# Patient Record
Sex: Female | Born: 1950 | Race: White | Marital: Married | Smoking: Former smoker
Health system: Southern US, Community
[De-identification: ages and names within clinical notes are randomized; demographics above are authoritative.]

## PROBLEM LIST (undated history)

## (undated) DIAGNOSIS — D126 Benign neoplasm of colon, unspecified: Secondary | ICD-10-CM

## (undated) DIAGNOSIS — M899 Disorder of bone, unspecified: Secondary | ICD-10-CM

## (undated) DIAGNOSIS — M949 Disorder of cartilage, unspecified: Secondary | ICD-10-CM

## (undated) DIAGNOSIS — H3560 Retinal hemorrhage, unspecified eye: Secondary | ICD-10-CM

## (undated) DIAGNOSIS — M25569 Pain in unspecified knee: Secondary | ICD-10-CM

## (undated) DIAGNOSIS — J45909 Unspecified asthma, uncomplicated: Secondary | ICD-10-CM

## (undated) DIAGNOSIS — D649 Anemia, unspecified: Secondary | ICD-10-CM

## (undated) DIAGNOSIS — I10 Essential (primary) hypertension: Secondary | ICD-10-CM

## (undated) DIAGNOSIS — D473 Essential (hemorrhagic) thrombocythemia: Secondary | ICD-10-CM

## (undated) DIAGNOSIS — M1710 Unilateral primary osteoarthritis, unspecified knee: Secondary | ICD-10-CM

## (undated) DIAGNOSIS — D179 Benign lipomatous neoplasm, unspecified: Secondary | ICD-10-CM

## (undated) DIAGNOSIS — M069 Rheumatoid arthritis, unspecified: Secondary | ICD-10-CM

## (undated) DIAGNOSIS — E7800 Pure hypercholesterolemia, unspecified: Secondary | ICD-10-CM

## (undated) DIAGNOSIS — D485 Neoplasm of uncertain behavior of skin: Secondary | ICD-10-CM

## (undated) DIAGNOSIS — R319 Hematuria, unspecified: Secondary | ICD-10-CM

## (undated) DIAGNOSIS — Z87891 Personal history of nicotine dependence: Secondary | ICD-10-CM

## (undated) HISTORY — DX: Anemia, unspecified: D64.9

## (undated) HISTORY — DX: Disorder of cartilage, unspecified: M94.9

## (undated) HISTORY — DX: Rheumatoid arthritis, unspecified: M06.9

## (undated) HISTORY — DX: Neoplasm of uncertain behavior of skin: D48.5

## (undated) HISTORY — DX: Hematuria, unspecified: R31.9

## (undated) HISTORY — DX: Essential (primary) hypertension: I10

## (undated) HISTORY — DX: Benign lipomatous neoplasm, unspecified: D17.9

## (undated) HISTORY — DX: Benign neoplasm of colon, unspecified: D12.6

## (undated) HISTORY — DX: Disorder of bone, unspecified: M89.9

## (undated) HISTORY — DX: Personal history of nicotine dependence: Z87.891

## (undated) HISTORY — DX: Retinal hemorrhage, unspecified eye: H35.60

## (undated) HISTORY — PX: TONSILLECTOMY: SUR1361

## (undated) HISTORY — DX: Pure hypercholesterolemia, unspecified: E78.00

## (undated) HISTORY — DX: Unspecified asthma, uncomplicated: J45.909

## (undated) HISTORY — DX: Pain in unspecified knee: M25.569

## (undated) HISTORY — DX: Essential (hemorrhagic) thrombocythemia: D47.3

## (undated) HISTORY — DX: Unilateral primary osteoarthritis, unspecified knee: M17.10

---

## 2007-11-15 HISTORY — PX: KNEE SURGERY: SHX244

## 2011-08-15 LAB — HM MAMMOGRAPHY: HM Mammogram: NORMAL

## 2012-09-17 LAB — HM COLONOSCOPY

## 2012-12-10 ENCOUNTER — Encounter: Payer: Self-pay | Admitting: *Deleted

## 2012-12-12 ENCOUNTER — Ambulatory Visit: Payer: Self-pay | Admitting: Family Medicine

## 2012-12-12 ENCOUNTER — Encounter: Payer: Self-pay | Admitting: Family Medicine

## 2012-12-12 ENCOUNTER — Ambulatory Visit (INDEPENDENT_AMBULATORY_CARE_PROVIDER_SITE_OTHER): Payer: BC Managed Care – PPO | Admitting: Family Medicine

## 2012-12-12 VITALS — BP 114/84 | HR 68 | Ht 63.0 in | Wt 135.0 lb

## 2012-12-12 DIAGNOSIS — J45909 Unspecified asthma, uncomplicated: Secondary | ICD-10-CM

## 2012-12-12 DIAGNOSIS — M069 Rheumatoid arthritis, unspecified: Secondary | ICD-10-CM | POA: Insufficient documentation

## 2012-12-12 DIAGNOSIS — M25559 Pain in unspecified hip: Secondary | ICD-10-CM

## 2012-12-12 DIAGNOSIS — M899 Disorder of bone, unspecified: Secondary | ICD-10-CM

## 2012-12-12 DIAGNOSIS — M8580 Other specified disorders of bone density and structure, unspecified site: Secondary | ICD-10-CM

## 2012-12-12 DIAGNOSIS — M79672 Pain in left foot: Secondary | ICD-10-CM

## 2012-12-12 DIAGNOSIS — Z0000 Encounter for general adult medical examination without abnormal findings: Secondary | ICD-10-CM

## 2012-12-12 DIAGNOSIS — Z23 Encounter for immunization: Secondary | ICD-10-CM

## 2012-12-12 DIAGNOSIS — E7800 Pure hypercholesterolemia, unspecified: Secondary | ICD-10-CM

## 2012-12-12 DIAGNOSIS — M79609 Pain in unspecified limb: Secondary | ICD-10-CM

## 2012-12-12 DIAGNOSIS — M25552 Pain in left hip: Secondary | ICD-10-CM

## 2012-12-12 LAB — POCT URINALYSIS DIPSTICK
Ketones, UA: NEGATIVE
Leukocytes, UA: NEGATIVE
Nitrite, UA: NEGATIVE
Protein, UA: NEGATIVE

## 2012-12-12 LAB — LIPID PANEL
LDL Cholesterol: 162 mg/dL — ABNORMAL HIGH (ref 0–99)
Triglycerides: 143 mg/dL (ref <150)

## 2012-12-12 LAB — TSH: TSH: 1.699 u[IU]/mL (ref 0.350–4.500)

## 2012-12-12 MED ORDER — ALBUTEROL SULFATE HFA 108 (90 BASE) MCG/ACT IN AERS: 2.0000 | INHALATION_SPRAY | Freq: Four times a day (QID) | RESPIRATORY_TRACT | Status: AC | PRN

## 2012-12-12 NOTE — Progress Notes (Signed)
Chief Complaint  Patient presents with  . Annual Exam    fasting annual exam with pap. Left sided hip pain, rheumatologist did films that were negative this past Dec. Pain is worse in the morning and after sitting for long periods of time. Left foot pain that she would you to check on, again rheumatologist examined and said was okay but she would like a second opinion. Wants to discuss her bp and she if she needs to be back on bp meds. Was on amlodipine 2.5mg .   Sandra Malone is a 62 y.o. female who presents for a complete physical.  She has the following concerns:  Hyperlipidemia--previously took statins, around 3 years ago.  She had some leg pains related to statins.  Went off statin, and f/u lipid was improved, so not started on other lipid agents.  Started on 2.5mg  amlodipine for ? borderline elevated BP, started on meds after she had a retinal hemorrhage.  She has been off amlodipine since 08/2012, wonders if she needs to restart it.  RA--diagnosed in summer 2012.  On Humira since 10/2011, after failing methotrexate alone.  Managed by Dr. Cardell Peach in [facility].  His records are available for review, including lab results.  She is complaining of L side pain, at lateral hip--when she wakes up in the morning, or after sitting for a while.  Pain since November or December.  Had hip x-ray at rheum, told it was fine. L foot pain--great toe, and base of 1st and 2nd toes.  Told it was a flare of RA by her rheum, but she thinks it feels different from her usual RA  Health Maintenance: Immunization History  Administered Date(s) Administered  . Influenza Split 10/14/2012  . Zoster 09/07/2011  pt believes she has had a pneumonia vaccine in past--will check with Dr. Cardell Peach or other records. Believes last tetanus >10 years ago Last Pap smear: 08/2011; normal paps every year, no h/o abnl Last mammogram: 08/2011; scheduled for mid February 2014 Last colonoscopy: 09/2012 Dr. Loman Chroman; every 5 years Last DEXA: 3  years ago; osteopenia per pt Dentist: twice yearly Ophtho: yearly Exercise:  Walks dog daily, not aerobic Labs from Dr. Cardell Peach reviewed from 10/2012: Normal LFT's, CBC. Nonfasting: Total chol 233; HDL 59; LDL 148; TG 172 (nonfasting) Hs-CRP 0.62 (normal)  LDL 178 in 06/2012.  Past Medical History  Diagnosis Date  . Anemia     resolved  . Benign neoplasm of colon     h/o polyp  . Hematuria, unspecified     resolved  . Unspecified essential hypertension     mild; currently off medication with normal BP  . Intrinsic asthma, unspecified     spring/fall; pt reports ? mild COPD on PFT's  . Pain in joint, lower leg   . Lipoma of unspecified site     back/shoulder blade  . Neoplasm of uncertain behavior of skin   . Disorder of bone and cartilage, unspecified     osteopenia  . Primary localized osteoarthrosis, lower leg     right and left knee  . Pure hypercholesterolemia   . Retinal hemorrhage   . Rheumatoid arthritis     f/b Dr. Cardell Peach in [facility]  . Essential thrombocythemia     resolved.  . Former smoker     Past Surgical History  Procedure Date  . Knee surgery 2009    torn meniscus, LEFT; arthroscopic  . Tonsillectomy     History   Social History  . Marital Status:  Married    Spouse Name: N/A    Number of Children: 0  . Years of Education: N/A   Occupational History  . retired (former Nurse, learning disability)    Social History Main Topics  . Smoking status: Former Smoker    Types: Cigarettes    Quit date: 11/14/2005  . Smokeless tobacco: Never Used  . Alcohol Use: Yes     Comment: maybe one drink once a month, and sometimes not even that.  . Drug Use: No  . Sexually Active: Yes -- Female partner(s)     Comment: postmenopausal   Other Topics Concern  . Not on file   Social History Narrative   Lives with husband, 1 dog.  Moved from [place] around 1991    Family History  Problem Relation Age of Onset  . Cancer Mother     leukemia  . Cancer Father     . Colon cancer Father 67  . Stroke Paternal Grandmother   . Colon cancer Paternal Grandfather   . Cancer Paternal Grandfather   . Diabetes Neg Hx   . Heart disease Neg Hx     Current outpatient prescriptions:adalimumab (HUMIRA) 40 MG/0.8ML injection, Inject 40 mg into the skin every 14 (fourteen) days., Disp: , Rfl: ;  albuterol (PROVENTIL HFA;VENTOLIN HFA) 108 (90 BASE) MCG/ACT inhaler, Inhale 2 puffs into the lungs every 6 (six) hours as needed., Disp: 18 g, Rfl: 1;  aspirin 81 MG tablet, Take 81 mg by mouth daily., Disp: , Rfl:  Calcium Carbonate-Vitamin D (CALCIUM 600+D3) 600-400 MG-UNIT per tablet, Take 1 tablet by mouth daily., Disp: , Rfl: ;  fluocinonide (LIDEX) 0.05 % external solution, Apply 1 application topically as needed., Disp: , Rfl: ;  folic acid (FOLVITE) 1 MG tablet, Take 1 mg by mouth daily., Disp: , Rfl: ;  LORazepam (ATIVAN) 0.5 MG tablet, Take 0.5 mg by mouth every 8 (eight) hours., Disp: , Rfl:  methotrexate 2.5 MG tablet, Take 20 mg by mouth once a week. Takes 8 tabs once a week (20mg ), Disp: , Rfl: ;  Multiple Vitamins-Minerals (MULTIVITAMIN WITH MINERALS) tablet, Take 1 tablet by mouth daily., Disp: , Rfl: ;  diclofenac sodium (VOLTAREN) 1 % GEL, Apply 2 g topically 4 (four) times daily., Disp: , Rfl:   Allergies  Allergen Reactions  . Aleve (Naproxen Sodium) Other (See Comments)    Bruising  . Evista (Raloxifene) Other (See Comments)    Visual disturbance   ROS:  The patient denies anorexia, fever, weight changes, headaches,  vision changes, decreased hearing, ear pain, sore throat, breast concerns, chest pain, palpitations, dizziness, syncope, dyspnea on exertion, cough, swelling, nausea, vomiting, diarrhea, constipation, abdominal pain, melena, hematochezia, indigestion/heartburn, hematuria, incontinence, dysuria, vaginal bleeding, discharge, odor or itch, genital lesions, numbness, tingling, weakness, tremor, suspicious skin lesions, depression, anxiety, abnormal  bleeding/bruising, or enlarged lymph nodes. Sees dermatologist, treated for scalp acne. Infrequent anxiety (related to husband) Knee pain, shoulder--overall improved.  L foot and L hip pain as per HPI  PHYSICAL EXAM: BP 114/84  Pulse 68  Ht 5\' 3"  (1.6 m)  Wt 135 lb (61.236 kg)  BMI 23.91 kg/m2  General Appearance:    Alert, cooperative, no distress, appears stated age  Head:    Normocephalic, without obvious abnormality, atraumatic  Eyes:    PERRL, conjunctiva/corneas clear, EOM's intact, fundi    benign  Ears:    Normal TM's and external ear canals  Nose:   Nares normal, mucosa normal, no drainage or sinus  tenderness  Throat:   Lips, mucosa, and tongue normal; teeth and gums normal  Neck:   Supple, no lymphadenopathy;  thyroid:  no   enlargement/tenderness/nodules; no carotid   bruit or JVD  Back:    Spine nontender, no curvature, ROM normal, no CVA     tenderness  Lungs:     Clear to auscultation bilaterally without wheezes, rales or     ronchi; respirations unlabored  Chest Wall:    No tenderness or deformity   Heart:    Regular rate and rhythm, S1 and S2 normal, no murmur, rub   or gallop  Breast Exam:    No tenderness, masses, or nipple discharge or inversion.      No axillary lymphadenopathy  Abdomen:     Soft, non-tender, nondistended, normoactive bowel sounds,    no masses, no hepatosplenomegaly  Genitalia:    Normal external genitalia without lesions.  BUS and vagina normal; no cervical motion tenderness. No abnormal vaginal discharge.  Uterus and adnexa not enlarged, nontender, no masses.  Pap not performed (only bimanual exam performed)  Rectal:    Normal tone, no masses or tenderness; guaiac negative stool  Extremities:   No clubbing, cyanosis or edema.  Mild inflammation at L 1st and 2nd MTP's, no warmth.  Tender at L ASIS, along the superior aspect.  nontender at trochanteric bursa.  No pain with ROM of hip.  Pulses:   2+ and symmetric all extremities  Skin:   Skin  color, texture, turgor normal, no rashes or lesions  Lymph nodes:   Cervical, supraclavicular, and axillary nodes normal  Neurologic:   CNII-XII intact, normal strength, sensation and gait; reflexes 2+ and symmetric throughout          Psych:   Normal mood, affect, hygiene and grooming.    ASSESSMENT/PLAN:  1. Routine general medical examination at a health care facility  POCT Urinalysis Dipstick, TSH  2. Rheumatoid arthritis    3. Pure hypercholesterolemia  Lipid panel  4. Need for Tdap vaccination  Tdap vaccine greater than or equal to 7yo IM  5. Osteopenia  TSH, Vitamin D 25 hydroxy  6. Asthma  albuterol (PROVENTIL HFA;VENTOLIN HFA) 108 (90 BASE) MCG/ACT inhaler   Glucose, lipids, TSH, Vitamin D-OH (rest of labs done through Dr. Cardell Peach).  No pap today--next year do pap with HPV so can go 5 years between paps if normal.  Reassured that foot pain is most likely RA flare, but discussed Ddx including OA, stress fracture.  Address with Dr. Cardell Peach if ongoing pain/swelling.  Hyperlipidemia--reviewed goals (LDL<130).  HS-CRP has been normal, and HDL and ratio has been ok, so if mildly above 130, don't need to be too aggressive in treatment.  Reviewed low cholesterol diet, and will check lipids today.  Hip pain--reassured not related to hip joint.  Likely has inflammation where tendon inserts.  Shown stretches.  Recommend use of ice vs heat.  Encouraged her to use voltaren gel on this area.  Asthma--mild, intermittent, mostly seasonal.  Needs updated inhaler, so rx sent.  Osteopenia (per pt)--due for repeat DEXA.  Given written rx so that she can get done in [facility], where she previously had DEXA, so they can make accurate comparison to prior study.  Discussed monthly self breast exams and yearly mammograms after the age of 54; at least 30 minutes of aerobic activity at least 5 days/week; proper sunscreen use reviewed; healthy diet, including goals of calcium and vitamin D intake and alcohol  recommendations (  less than or equal to 1 drink/day) reviewed; regular seatbelt use; changing batteries in smoke detectors.  Immunization recommendations discussed--TdaP given today.  To find out date of pneumovax; will need to get regularly.  Colonoscopy recommendations reviewed, UTD.  Total visit time >55 minutes, all face to face, more than 1/2 counseling

## 2012-12-12 NOTE — Patient Instructions (Addendum)
HEALTH MAINTENANCE RECOMMENDATIONS:  It is recommended that you get at least 30 minutes of aerobic exercise at least 5 days/week (for weight loss, you may need as much as 60-90 minutes). This can be any activity that gets your heart rate up. This can be divided in 10-15 minute intervals if needed, but try and build up your endurance at least once a week.  Weight bearing exercise is also recommended twice weekly.  Eat a healthy diet with lots of vegetables, fruits and fiber.  "Colorful" foods have a lot of vitamins (ie green vegetables, tomatoes, red peppers, etc).  Limit sweet tea, regular sodas and alcoholic beverages, all of which has a lot of calories and sugar.  Up to 1 alcoholic drink daily may be beneficial for women (unless trying to lose weight, watch sugars).  Drink a lot of water.  Calcium recommendations are 1200-1500 mg daily (1500 mg for postmenopausal women or women without ovaries), and vitamin D 1000 IU daily.  This should be obtained from diet and/or supplements (vitamins), and calcium should not be taken all at once, but in divided doses.  Monthly self breast exams and yearly mammograms for women over the age of 57 is recommended.  Sunscreen of at least SPF 30 should be used on all sun-exposed parts of the skin when outside between the hours of 10 am and 4 pm (not just when at beach or pool, but even with exercise, golf, tennis, and yard work!)  Use a sunscreen that says "broad spectrum" so it covers both UVA and UVB rays, and make sure to reapply every 1-2 hours.  Remember to change the batteries in your smoke detectors when changing your clock times in the spring and fall.  Use your seat belt every time you are in a car, and please drive safely and not be distracted with cell phones and texting while driving.   Fat and Cholesterol Control Diet Cholesterol levels in your body are determined significantly by your diet. Cholesterol levels may also be related to heart disease. The  following material helps to explain this relationship and discusses what you can do to help keep your heart healthy. Not all cholesterol is bad. Low-density lipoprotein (LDL) cholesterol is the "bad" cholesterol. It may cause fatty deposits to build up inside your arteries. High-density lipoprotein (HDL) cholesterol is "good." It helps to remove the "bad" LDL cholesterol from your blood. Cholesterol is a very important risk factor for heart disease. Other risk factors are high blood pressure, smoking, stress, heredity, and weight. The heart muscle gets its supply of blood through the coronary arteries. If your LDL cholesterol is high and your HDL cholesterol is low, you are at risk for having fatty deposits build up in your coronary arteries. This leaves less room through which blood can flow. Without sufficient blood and oxygen, the heart muscle cannot function properly and you may feel chest pains (angina pectoris). When a coronary artery closes up entirely, a part of the heart muscle may die causing a heart attack (myocardial infarction). CHECKING CHOLESTEROL When your caregiver sends your blood to a lab to be examined for cholesterol, a complete lipid (fat) profile may be done. With this test, the total amount of cholesterol and levels of LDL and HDL are determined. Triglycerides are a type of fat that circulates in the blood. They can also be used to determine heart disease risk. The list below describes what the numbers should be: Test: Total Cholesterol.  Less than 200 mg/dl. Test: LDL "  bad cholesterol."  Less than 100 mg/dl.  Less than 70 mg/dl if you are at very high risk of a heart attack or sudden cardiac death. Test: HDL "good cholesterol."  Greater than 50 mg/dl for women.  Greater than 40 mg/dl for men. Test: Triglycerides.  Less than 150 mg/dl. CONTROLLING CHOLESTEROL WITH DIET Although exercise and lifestyle factors are important, your diet is key. That is because certain foods  are known to raise cholesterol and others to lower it. The goal is to balance foods for their effect on cholesterol and more importantly, to replace saturated and trans fat with other types of fat, such as monounsaturated fat, polyunsaturated fat, and omega-3 fatty acids. On average, a person should consume no more than 15 to 17 g of saturated fat daily. Saturated and trans fats are considered "bad" fats, and they will raise LDL cholesterol. Saturated fats are primarily found in animal products such as meats, butter, and cream. However, that does not mean you need to give up all your favorite foods. Today, there are good tasting, low-fat, low-cholesterol substitutes for most of the things you like to eat. Choose low-fat or nonfat alternatives. Choose round or loin cuts of red meat. These types of cuts are lowest in fat and cholesterol. Chicken (without the skin), fish, veal, and ground [place] breast are great choices. Eliminate fatty meats, such as hot dogs and salami. Even shellfish have little or no saturated fat. Have a 3 oz (85 g) portion when you eat lean meat, poultry, or fish. Trans fats are also called "partially hydrogenated oils." They are oils that have been scientifically manipulated so that they are solid at room temperature resulting in a longer shelf life and improved taste and texture of foods in which they are added. Trans fats are found in stick margarine, some tub margarines, cookies, crackers, and baked goods.  When baking and cooking, oils are a great substitute for butter. The monounsaturated oils are especially beneficial since it is believed they lower LDL and raise HDL. The oils you should avoid entirely are saturated tropical oils, such as coconut and palm.  Remember to eat a lot from food groups that are naturally free of saturated and trans fat, including fish, fruit, vegetables, beans, grains (barley, rice, couscous, bulgur wheat), and pasta (without cream sauces).  IDENTIFYING  FOODS THAT LOWER CHOLESTEROL  Soluble fiber may lower your cholesterol. This type of fiber is found in fruits such as apples, vegetables such as broccoli, potatoes, and carrots, legumes such as beans, peas, and lentils, and grains such as barley. Foods fortified with plant sterols (phytosterol) may also lower cholesterol. You should eat at least 2 g per day of these foods for a cholesterol lowering effect.  Read package labels to identify low-saturated fats, trans fat free, and low-fat foods at the supermarket. Select cheeses that have only 2 to 3 g saturated fat per ounce. Use a heart-healthy tub margarine that is free of trans fats or partially hydrogenated oil. When buying baked goods (cookies, crackers), avoid partially hydrogenated oils. Breads and muffins should be made from whole grains (whole-wheat or whole oat flour, instead of "flour" or "enriched flour"). Buy non-creamy canned soups with reduced salt and no added fats.  FOOD PREPARATION TECHNIQUES  Never deep-fry. If you must fry, either stir-fry, which uses very little fat, or use non-stick cooking sprays. When possible, broil, bake, or roast meats, and steam vegetables. Instead of putting butter or margarine on vegetables, use lemon and herbs, applesauce, and  cinnamon (for squash and sweet potatoes), nonfat yogurt, salsa, and low-fat dressings for salads.  LOW-SATURATED FAT / LOW-FAT FOOD SUBSTITUTES Meats / Saturated Fat (g)  Avoid: Steak, marbled (3 oz/85 g) / 11 g  Choose: Steak, lean (3 oz/85 g) / 4 g  Avoid: Hamburger (3 oz/85 g) / 7 g  Choose: Hamburger, lean (3 oz/85 g) / 5 g  Avoid: Ham (3 oz/85 g) / 6 g  Choose: Ham, lean cut (3 oz/85 g) / 2.4 g  Avoid: Chicken, with skin, dark meat (3 oz/85 g) / 4 g  Choose: Chicken, skin removed, dark meat (3 oz/85 g) / 2 g  Avoid: Chicken, with skin, light meat (3 oz/85 g) / 2.5 g  Choose: Chicken, skin removed, light meat (3 oz/85 g) / 1 g Dairy / Saturated Fat (g)  Avoid: Whole  milk (1 cup) / 5 g  Choose: Low-fat milk, 2% (1 cup) / 3 g  Choose: Low-fat milk, 1% (1 cup) / 1.5 g  Choose: Skim milk (1 cup) / 0.3 g  Avoid: Hard cheese (1 oz/28 g) / 6 g  Choose: Skim milk cheese (1 oz/28 g) / 2 to 3 g  Avoid: Cottage cheese, 4% fat (1 cup) / 6.5 g  Choose: Low-fat cottage cheese, 1% fat (1 cup) / 1.5 g  Avoid: Ice cream (1 cup) / 9 g  Choose: Sherbet (1 cup) / 2.5 g  Choose: Nonfat frozen yogurt (1 cup) / 0.3 g  Choose: Frozen fruit bar / trace  Avoid: Whipped cream (1 tbs) / 3.5 g  Choose: Nondairy whipped topping (1 tbs) / 1 g Condiments / Saturated Fat (g)  Avoid: Mayonnaise (1 tbs) / 2 g  Choose: Low-fat mayonnaise (1 tbs) / 1 g  Avoid: Butter (1 tbs) / 7 g  Choose: Extra light margarine (1 tbs) / 1 g  Avoid: Coconut oil (1 tbs) / 11.8 g  Choose: Olive oil (1 tbs) / 1.8 g  Choose: Corn oil (1 tbs) / 1.7 g  Choose: Safflower oil (1 tbs) / 1.2 g  Choose: Sunflower oil (1 tbs) / 1.4 g  Choose: Soybean oil (1 tbs) / 2.4 g  Choose: Canola oil (1 tbs) / 1 g Document Released: 10/31/2005 Document Revised: 01/23/2012 Document Reviewed: 04/21/2011 [facility] Patient Information 2013 [place], [place].

## 2012-12-13 LAB — VITAMIN D 25 HYDROXY (VIT D DEFICIENCY, FRACTURES): Vit D, 25-Hydroxy: 46 ng/mL (ref 30–89)

## 2013-01-07 LAB — HM MAMMOGRAPHY: HM Mammogram: NEGATIVE

## 2013-01-09 ENCOUNTER — Encounter: Payer: Self-pay | Admitting: Internal Medicine

## 2013-07-09 ENCOUNTER — Encounter: Payer: Self-pay | Admitting: Family Medicine

## 2013-07-25 ENCOUNTER — Ambulatory Visit (INDEPENDENT_AMBULATORY_CARE_PROVIDER_SITE_OTHER): Payer: BC Managed Care – PPO | Admitting: Family Medicine

## 2013-07-25 ENCOUNTER — Encounter: Payer: Self-pay | Admitting: Family Medicine

## 2013-07-25 VITALS — BP 130/70 | HR 76 | Ht 63.0 in | Wt 136.0 lb

## 2013-07-25 DIAGNOSIS — Z23 Encounter for immunization: Secondary | ICD-10-CM

## 2013-07-25 DIAGNOSIS — E7800 Pure hypercholesterolemia, unspecified: Secondary | ICD-10-CM

## 2013-07-25 NOTE — Patient Instructions (Signed)
Continue low cholesterol diet, daily exercise. Plan to check [facility] labs (looking at particle size and number) when you come for you physical.

## 2013-07-25 NOTE — Progress Notes (Signed)
Chief Complaint  Patient presents with  . Follow-up    lab results.   Patient presents for follow-up on her cholesterol.  Labs were drawn through her rheumatologist (see scanned sheet).  Her risk factors were reviewed.  There is family h/o stroke (PGM in 52's), not at young age.  Pt with no risk factors--had some high blood pressures when dealing with an eye issue; has been off medications with normal blood pressures since 09/2012. She quit smoking in 2007.  She follows a diet fairly low in cholesterol, reporting that her "downfall" is her cheese, but not having daily.  She informs me/reminds me today that her medications can contribute to her elevated cholesterol.    RA:  Mostly has pain in her knees.  Intermittent pain in her right arm, swelling in left foot.  Pain is minor.  Overall is doing very well controlled on her current regimen.  Past Medical History  Diagnosis Date  . Anemia     resolved  . Benign neoplasm of colon     h/o polyp  . Hematuria, unspecified     resolved  . Unspecified essential hypertension     mild; currently off medication with normal BP  . Intrinsic asthma, unspecified     spring/fall; pt reports ? mild COPD on PFT's  . Pain in joint, lower leg   . Lipoma of unspecified site     back/shoulder blade  . Neoplasm of uncertain behavior of skin   . Disorder of bone and cartilage, unspecified     osteopenia  . Primary localized osteoarthrosis, lower leg     right and left knee  . Pure hypercholesterolemia   . Retinal hemorrhage   . Rheumatoid arthritis(714.0)     f/b Dr. Cardell Peach in [facility]  . Essential thrombocythemia     resolved.  . Former smoker    Past Surgical History  Procedure Laterality Date  . Knee surgery  2009    torn meniscus, LEFT; arthroscopic  . Tonsillectomy     History   Social History  . Marital Status: Married    Spouse Name: N/A    Number of Children: 0  . Years of Education: N/A   Occupational History  . retired (former  Nurse, learning disability)    Social History Main Topics  . Smoking status: Former Smoker    Types: Cigarettes    Quit date: 11/14/2005  . Smokeless tobacco: Never Used  . Alcohol Use: Yes     Comment: maybe one drink once a month, and sometimes not even that.  . Drug Use: No  . Sexual Activity: Yes    Partners: Male     Comment: postmenopausal   Other Topics Concern  . Not on file   Social History Narrative   Lives with husband, 1 dog.  Moved from [place] around 1991   Family History  Problem Relation Age of Onset  . Cancer Mother     leukemia  . Cancer Father   . Colon cancer Father 34  . Stroke Paternal Grandmother     died from stroke, probably age 31's  . Colon cancer Paternal Grandfather   . Cancer Paternal Grandfather   . Diabetes Neg Hx   . Heart disease Neg Hx    Current outpatient prescriptions:adalimumab (HUMIRA) 40 MG/0.8ML injection, Inject 40 mg into the skin every 14 (fourteen) days., Disp: , Rfl: ;  aspirin 81 MG tablet, Take 81 mg by mouth daily., Disp: , Rfl: ;  Calcium Carbonate-Vitamin D (CALCIUM 600+D3) 600-400 MG-UNIT per tablet, Take 1 tablet by mouth daily., Disp: , Rfl: ;  folic acid (FOLVITE) 1 MG tablet, Take 1 mg by mouth daily., Disp: , Rfl:  methotrexate 2.5 MG tablet, Take 20 mg by mouth once a week. Takes 8 tabs once a week (20mg ), Disp: , Rfl: ;  Multiple Vitamins-Minerals (MULTIVITAMIN WITH MINERALS) tablet, Take 1 tablet by mouth daily., Disp: , Rfl: ;  albuterol (PROVENTIL HFA;VENTOLIN HFA) 108 (90 BASE) MCG/ACT inhaler, Inhale 2 puffs into the lungs every 6 (six) hours as needed., Disp: 18 g, Rfl: 1 diclofenac sodium (VOLTAREN) 1 % GEL, Apply 2 g topically 4 (four) times daily., Disp: , Rfl: ;  fluocinonide (LIDEX) 0.05 % external solution, Apply 1 application topically as needed., Disp: , Rfl: ;  LORazepam (ATIVAN) 0.5 MG tablet, Take 0.5 mg by mouth every 8 (eight) hours., Disp: , Rfl:   Allergies  Allergen Reactions  . Aleve [Naproxen  Sodium] Other (See Comments)    Bruising  . Evista [Raloxifene] Other (See Comments)    Visual disturbance    ROS:  Denies headaches, dizziness, chest pain, URI symptoms, shortness of breath, bleeding/bruising.  Joint pains as per HPI.  Moods are good  PHYSICAL EXAM: BP 130/70  Pulse 76  Ht 5\' 3"  (1.6 m)  Wt 136 lb (61.689 kg)  BMI 24.1 kg/m2 Well developed, pleasant female in no distress Remainder of visit was limited to discussion and counseling.  Labs: Total chol 234;  TG 106; HDL 58; LDL 155; chol/HDL ratio 4  ASSESSMENT/PLAN:  Pure hypercholesterolemia - slight improvement since January. Still borderline.  reviewed diet, exercise, effects of genes and meds.  Check [facility] at CPE  Need for prophylactic vaccination and inoculation against influenza - Plan: Flu Vaccine QUAD 36+ mos IM  Continue low cholesterol diet, daily exercise.  Has CPE in February--plan to do [facility] labs at that time, in addition to other labs needed.

## 2014-02-06 ENCOUNTER — Other Ambulatory Visit (HOSPITAL_COMMUNITY)
Admission: RE | Admit: 2014-02-06 | Discharge: 2014-02-06 | Disposition: A | Discharge status: Home / Self Care | Payer: BC Managed Care – PPO | Source: Ambulatory Visit | Attending: Family Medicine | Admitting: Family Medicine

## 2014-02-06 ENCOUNTER — Ambulatory Visit (INDEPENDENT_AMBULATORY_CARE_PROVIDER_SITE_OTHER): Payer: BC Managed Care – PPO | Admitting: Family Medicine

## 2014-02-06 ENCOUNTER — Encounter: Payer: Self-pay | Admitting: Family Medicine

## 2014-02-06 VITALS — BP 132/80 | HR 64 | Ht 63.0 in | Wt 136.0 lb

## 2014-02-06 DIAGNOSIS — Z0000 Encounter for general adult medical examination without abnormal findings: Secondary | ICD-10-CM

## 2014-02-06 DIAGNOSIS — E7800 Pure hypercholesterolemia, unspecified: Secondary | ICD-10-CM

## 2014-02-06 DIAGNOSIS — J45909 Unspecified asthma, uncomplicated: Secondary | ICD-10-CM

## 2014-02-06 DIAGNOSIS — Z01419 Encounter for gynecological examination (general) (routine) without abnormal findings: Secondary | ICD-10-CM | POA: Insufficient documentation

## 2014-02-06 DIAGNOSIS — Z1151 Encounter for screening for human papillomavirus (HPV): Secondary | ICD-10-CM | POA: Insufficient documentation

## 2014-02-06 LAB — POCT URINALYSIS DIPSTICK
Bilirubin, UA: NEGATIVE
Glucose, UA: NEGATIVE
Ketones, UA: NEGATIVE
Leukocytes, UA: NEGATIVE
NITRITE UA: NEGATIVE
PH UA: 5
Protein, UA: NEGATIVE
RBC UA: NEGATIVE
Spec Grav, UA: <=1.005
UROBILINOGEN UA: NEGATIVE

## 2014-02-06 NOTE — Progress Notes (Signed)
Chief Complaint  Patient presents with  . Annual Exam    fasting annual exam with pap. Did not do eye exam today she had one with [facility] care recently.  Yesterday developed a pain on her left side thought she would bring up while she was here today.    Sandra Malone is a 63 y.o. female who presents for a complete physical.  She has the following concerns:  Yesterday she noted some discomfort in her left groin.  Pain is mild, only pays attention to it and notices it when she is sitting inactive. Describes it as an ache.  No pain with standing, stairs, walking.  Hyperlipidemia:  Due for follow up today. HS-CRP was normal in the past; recommended goal LDL <130.  Last check here 1 year ago her cholesterol was high: Lab Results  Component Value Date   CHOL 246* 12/12/2012   HDL 55 12/12/2012   LDLCALC 162* 12/12/2012   TRIG 143 12/12/2012   CHOLHDL 4.5 12/12/2012   She had been on low cholesterol diet, and had labs done by rheumatologist in 06/2013.  These showed total chol of 234, TG 106, HDL 58, LDL 155, chol/HDL ratio of 4. She was seen here in f/u in September, and recommended to continue low cholesterol diet, and to have [facility] study drawn at her physical.  Asthma:  She has seasonal allergies, with seasonal wheezing when allergies are flaring, usually just in the fall/spring.  She hasn't needed to use any albuterol in over a year.  She has an unopened inhaler at home.  Allergies haven't started flaring yet this spring.  Osteopenia:  She never went for DEXA last year as prescribed (to be done in [facility]).  She was put on Evista at one point many years ago (not after the last DEXA 4 years ago), and she didn't tolerate it--had some visual disturbance/blurring.  When talking to the doctor it wasn't really clear that she needed to take it, med was not replaced once she had side effects, and no further treatment was recommended after subsequent bone density tests.  RA:  Well controlled with  Humira and methotrexate.  Monitored by Dr. Abner Greenspan.  Immunization History  Administered Date(s) Administered  . Influenza Split 10/14/2012  . Influenza,inj,Quad PF,36+ Mos 07/25/2013  . Tdap 12/12/2012  . Zoster 09/07/2011  She believes she had pneumonia vaccine in the past. This is in fact documented that she had one in an old note from Dr. Abner Greenspan, but the date was not noted.  She believes it was within 5 years.  She has appt with him next month Last Pap smear: 08/2011 Last mammogram:  February 2014, had to cancel it due to broken machine; has appt scheduled Last colonoscopy: 09/2012 Dr. Dorrene German; every 5 years  Last DEXA: 4 years ago; osteopenia per pt; rx given last year to get again in [employer] Dentist: twice yearly  Ophtho: yearly  Exercise: Walks dog daily, not aerobic  Past Medical History  Diagnosis Date  . Anemia     resolved  . Benign neoplasm of colon     h/o polyp  . Hematuria, unspecified     resolved  . Unspecified essential hypertension     mild; currently off medication with normal BP (put on originally due to abnormal eye finding)  . Intrinsic asthma, unspecified     spring/fall; pt reports ? mild COPD on PFT's  . Pain in joint, lower leg   . Lipoma of unspecified site  back/shoulder blade  . Neoplasm of uncertain behavior of skin   . Disorder of bone and cartilage, unspecified     osteopenia  . Primary localized osteoarthrosis, lower leg     right and left knee  . Pure hypercholesterolemia   . Retinal hemorrhage   . Rheumatoid arthritis(714.0)     f/b Dr. Abner Greenspan in [facility]  . Essential thrombocythemia     resolved.  . Former smoker     Past Surgical History  Procedure Laterality Date  . Knee surgery Left 2009    torn meniscus, LEFT; arthroscopic  . Tonsillectomy      History   Social History  . Marital Status: Married    Spouse Name: N/A    Number of Children: 0  . Years of Education: N/A   Occupational History  . retired (former Energy manager)    Social History Main Topics  . Smoking status: Former Smoker    Types: Cigarettes    Quit date: 11/14/2005  . Smokeless tobacco: Never Used  . Alcohol Use: Yes     Comment: maybe one drink once a month, and sometimes not even that.  . Drug Use: No  . Sexual Activity: Yes    Partners: Male     Comment: postmenopausal   Other Topics Concern  . Not on file   Social History Narrative   Lives with husband, 1 dog.  Moved from [place] around 1991    Family History  Problem Relation Age of Onset  . Cancer Mother     leukemia  . Glaucoma Mother   . Cancer Father   . Colon cancer Father 70  . Stroke Paternal Grandmother     died from stroke, probably age 2's  . Colon cancer Paternal Grandfather   . Cancer Paternal Grandfather   . Diabetes Neg Hx   . Heart disease Neg Hx   . Arthritis Maternal Grandmother     rheumatoid arthritis   Outpatient Encounter Prescriptions as of 02/06/2014  Medication Sig Note  . adalimumab (HUMIRA) 40 MG/0.8ML injection Inject 40 mg into the skin every 14 (fourteen) days.   Marland Kitchen aspirin 81 MG tablet Take 81 mg by mouth daily.   . Calcium Carbonate-Vitamin D (CALCIUM 600+D3) 600-400 MG-UNIT per tablet Take 1 tablet by mouth daily.   . folic acid (FOLVITE) 1 MG tablet Take 1 mg by mouth daily.   Marland Kitchen LORazepam (ATIVAN) 0.5 MG tablet Take 0.5 mg by mouth every 8 (eight) hours. 12/12/2012: Uses prn for "nerves"; usually used around 2x/month  . methotrexate 2.5 MG tablet Take 20 mg by mouth once a week. Takes 8 tabs once a week (37m)   . Multiple Vitamins-Minerals (MULTIVITAMIN WITH MINERALS) tablet Take 1 tablet by mouth daily.   .Marland Kitchenalbuterol (PROVENTIL HFA;VENTOLIN HFA) 108 (90 BASE) MCG/ACT inhaler Inhale 2 puffs into the lungs every 6 (six) hours as needed. 02/06/2014: Has not needed to use any rescue inhaler since prior to last visit  . diclofenac sodium (VOLTAREN) 1 % GEL Apply 2 g topically 4 (four) times daily. 02/06/2014: Uses prn (has  samples from Dr. GAbner Greenspan for foot swelling  . fluocinonide (LIDEX) 0.05 % external solution Apply 1 application topically as needed. 12/12/2012: For "scalp acne", rx'd by dermatologist    Allergies  Allergen Reactions  . Aleve [Naproxen Sodium] Other (See Comments)    Bruising  . Evista [Raloxifene] Other (See Comments)    Visual disturbance   ROS: The patient denies anorexia,  fever, weight changes, headaches, vision changes, decreased hearing, ear pain, sore throat, breast concerns, chest pain, palpitations, dizziness, syncope, dyspnea on exertion, cough, swelling, nausea, vomiting, diarrhea, constipation, abdominal pain, melena, hematochezia, indigestion/heartburn, hematuria, incontinence, dysuria, vaginal bleeding, discharge, odor or itch, genital lesions, numbness, tingling, weakness, tremor, suspicious skin lesions, depression, anxiety, abnormal bleeding/bruising, or enlarged lymph nodes.  Sees dermatologist, treated for scalp acne.  Infrequent anxiety (related to husband), unchanged Knee pain, shoulder--overall improved, stable, and occasional swelling and pain on top of her left foot.  PHYSICAL EXAM: BP 132/80  Pulse 64  Ht 5' 3"  (1.6 m)  Wt 136 lb (61.689 kg)  BMI 24.10 kg/m2  General Appearance:  Alert, cooperative, no distress, appears stated age   Head:  Normocephalic, without obvious abnormality, atraumatic   Eyes:  PERRL, conjunctiva/corneas clear, EOM's intact, fundi  benign   Ears:  Normal TM's and external ear canals   Nose:  Nares normal, mucosa mildly edematous, no drainage or sinus tenderness   Throat:  Lips, mucosa, and tongue normal; teeth and gums normal   Neck:  Supple, no lymphadenopathy; thyroid: no enlargement/tenderness/nodules; no carotid  bruit or JVD   Back:  Spine nontender, no curvature, ROM normal, no CVA tenderness   Lungs:  Clear to auscultation bilaterally without wheezes, rales or ronchi; respirations unlabored   Chest Wall:  No tenderness or  deformity   Heart:  Regular rate and rhythm, S1 and S2 normal, no murmur, rub  or gallop   Breast Exam:  No tenderness, masses, or nipple discharge or inversion. No axillary lymphadenopathy   Abdomen:  Soft, non-tender, nondistended, normoactive bowel sounds,  no masses, no hepatosplenomegaly   Genitalia:  Normal external genitalia without lesions. Atrophic changes noted. BUS and vagina normal; no cervical motion tenderness. Nulliparous cervix without lesions. No abnormal vaginal discharge. Uterus and adnexa not enlarged, nontender.  Pap performed  Rectal:  Normal tone, no masses or tenderness; guaiac negative stool   Extremities:  No clubbing, cyanosis or edema. No pain with ROM of hip, or hip flexion against resistance;  nontender at groin, no adenopathy  Pulses:  2+ and symmetric all extremities    Skin:  Skin color, texture, turgor normal, no rashes or lesions   Lymph nodes:  Cervical, supraclavicular, and axillary nodes normal   Neurologic:  CNII-XII intact, normal strength, sensation and gait; reflexes 2+ and symmetric throughout         Psych: Normal mood, affect, hygiene and grooming.    ASSESSMENT/PLAN:  Routine general medical examination at a health care facility - Plan: POCT Urinalysis Dipstick, Cytology - PAP [place]  Asthma - intermittent/infrequent, with no recent flare.  may be related to allergies--use OTC antihistamines for allergies; has albuterol to use prn. F/u if frequent use  Pure hypercholesterolemia - [facility] labs today  Pap with HPV; if normal next pap due in 5 years, but yearly exams recommended.  Rewrote rx for DEXA in [facility], reminded to schedule  Pneumonia vaccination--she has had pneumovax (?date).  Likely should get Prevnar-13 when 63. She plans to check with Dr. Abner Greenspan at her upcoming appointment in April, and likely will get through his office.  She will have him send a copy of his notes and immunization. Continue yearly flu shots.  Colon  cancer screening--colonoscopy due 09/2017  TSH normal last year (here) Gets c-met, CBC screened regularly through rheum (last labs done in January, no results available; 06/2013 all normal) [facility] today; no other labs needed. She  is fasting when she sees rheum for her labs, so getting diabetes screened regularly.  F/u based on [facility] results.  If okay/stable, f/u yearly for CPE

## 2014-02-06 NOTE — Patient Instructions (Signed)
  HEALTH MAINTENANCE RECOMMENDATIONS:  It is recommended that you get at least 30 minutes of aerobic exercise at least 5 days/week (for weight loss, you may need as much as 60-90 minutes). This can be any activity that gets your heart rate up. This can be divided in 10-15 minute intervals if needed, but try and build up your endurance at least once a week.  Weight bearing exercise is also recommended twice weekly.  Eat a healthy diet with lots of vegetables, fruits and fiber.  "Colorful" foods have a lot of vitamins (ie green vegetables, tomatoes, red peppers, etc).  Limit sweet tea, regular sodas and alcoholic beverages, all of which has a lot of calories and sugar.  Up to 1 alcoholic drink daily may be beneficial for women (unless trying to lose weight, watch sugars).  Drink a lot of water.  Calcium recommendations are 1200-1500 mg daily (1500 mg for postmenopausal women or women without ovaries), and vitamin D 1000 IU daily.  This should be obtained from diet and/or supplements (vitamins), and calcium should not be taken all at once, but in divided doses.  Monthly self breast exams and yearly mammograms for women over the age of 27 is recommended.  Sunscreen of at least SPF 30 should be used on all sun-exposed parts of the skin when outside between the hours of 10 am and 4 pm (not just when at beach or pool, but even with exercise, golf, tennis, and yard work!)  Use a sunscreen that says "broad spectrum" so it covers both UVA and UVB rays, and make sure to reapply every 1-2 hours.  Remember to change the batteries in your smoke detectors when changing your clock times in the spring and fall.  Use your seat belt every time you are in a car, and please drive safely and not be distracted with cell phones and texting while driving.  Schedule your bone density test (you were given a written prescription as an order).

## 2014-02-10 ENCOUNTER — Encounter: Payer: Self-pay | Admitting: Family Medicine

## 2014-02-25 LAB — HM MAMMOGRAPHY: HM Mammogram: NEGATIVE

## 2014-02-26 ENCOUNTER — Encounter: Payer: Self-pay | Admitting: Internal Medicine

## 2014-03-20 ENCOUNTER — Encounter: Payer: Self-pay | Admitting: Family Medicine

## 2014-04-16 ENCOUNTER — Encounter: Payer: Self-pay | Admitting: Family Medicine

## 2019-05-15 DIAGNOSIS — Z79899 Other long term (current) drug therapy: Secondary | ICD-10-CM | POA: Diagnosis not present

## 2019-05-15 DIAGNOSIS — M0579 Rheumatoid arthritis with rheumatoid factor of multiple sites without organ or systems involvement: Secondary | ICD-10-CM | POA: Diagnosis not present

## 2019-08-20 DIAGNOSIS — M0579 Rheumatoid arthritis with rheumatoid factor of multiple sites without organ or systems involvement: Secondary | ICD-10-CM | POA: Diagnosis not present

## 2019-08-20 DIAGNOSIS — Z79899 Other long term (current) drug therapy: Secondary | ICD-10-CM | POA: Diagnosis not present

## 2019-09-10 DIAGNOSIS — H3401 Transient retinal artery occlusion, right eye: Secondary | ICD-10-CM | POA: Diagnosis not present

## 2019-09-10 DIAGNOSIS — H2513 Age-related nuclear cataract, bilateral: Secondary | ICD-10-CM | POA: Diagnosis not present

## 2019-09-10 DIAGNOSIS — H43813 Vitreous degeneration, bilateral: Secondary | ICD-10-CM | POA: Diagnosis not present

## 2019-09-10 DIAGNOSIS — H11151 Pinguecula, right eye: Secondary | ICD-10-CM | POA: Diagnosis not present

## 2019-10-01 DIAGNOSIS — L720 Epidermal cyst: Secondary | ICD-10-CM | POA: Diagnosis not present

## 2019-10-01 DIAGNOSIS — L300 Nummular dermatitis: Secondary | ICD-10-CM | POA: Diagnosis not present

## 2019-10-01 DIAGNOSIS — D1801 Hemangioma of skin and subcutaneous tissue: Secondary | ICD-10-CM | POA: Diagnosis not present

## 2019-10-01 DIAGNOSIS — L299 Pruritus, unspecified: Secondary | ICD-10-CM | POA: Diagnosis not present

## 2019-10-01 DIAGNOSIS — L219 Seborrheic dermatitis, unspecified: Secondary | ICD-10-CM | POA: Diagnosis not present

## 2019-11-26 DIAGNOSIS — Z79899 Other long term (current) drug therapy: Secondary | ICD-10-CM | POA: Diagnosis not present

## 2019-11-26 DIAGNOSIS — M0579 Rheumatoid arthritis with rheumatoid factor of multiple sites without organ or systems involvement: Secondary | ICD-10-CM | POA: Diagnosis not present

## 2019-12-11 ENCOUNTER — Ambulatory Visit: Payer: Self-pay

## 2019-12-20 ENCOUNTER — Ambulatory Visit: Payer: Medicare HMO | Attending: Internal Medicine

## 2019-12-20 DIAGNOSIS — Z23 Encounter for immunization: Secondary | ICD-10-CM | POA: Insufficient documentation

## 2019-12-20 NOTE — Progress Notes (Signed)
   Covid-19 Vaccination Clinic  Name:  Windi Mcfaul    MRN: [id number] DOB: 10-19-1951  12/20/2019  Ms. Corbit was observed post Covid-19 immunization for 15 minutes without incidence. She was provided with Vaccine Information Sheet and instruction to access the V-Safe system.   Ms. Kirkham was instructed to call 911 with any severe reactions post vaccine: Marland Kitchen Difficulty breathing  . Swelling of your face and throat  . A fast heartbeat  . A bad rash all over your body  . Dizziness and weakness    Immunizations Administered    Name Date Dose VIS Date Route   Pfizer COVID-19 Vaccine 12/20/2019  1:50 PM 0.3 mL 10/25/2019 Intramuscular   Manufacturer: [place]   Lot: [id number]   [place]: [id number]

## 2020-01-14 ENCOUNTER — Ambulatory Visit: Payer: Medicare HMO | Attending: Internal Medicine

## 2020-01-14 DIAGNOSIS — Z23 Encounter for immunization: Secondary | ICD-10-CM | POA: Insufficient documentation

## 2020-01-14 NOTE — Progress Notes (Signed)
   Covid-19 Vaccination Clinic  Name:  Sandra Malone    MRN: [id number] DOB: 1951-05-24  01/14/2020  [name] was observed post Covid-19 immunization for 15 minutes without incident. She was provided with Vaccine Information Sheet and instruction to access the V-Safe system.   [name] was instructed to call 911 with any severe reactions post vaccine: Marland Kitchen Difficulty breathing  . Swelling of face and throat  . A fast heartbeat  . A bad rash all over body  . Dizziness and weakness   Immunizations Administered    Name Date Dose VIS Date Route   Pfizer COVID-19 Vaccine 01/14/2020  2:05 PM 0.3 mL 10/25/2019 Intramuscular   Manufacturer: [place]   Lot: [id number]   [place]: [id number]

## 2020-02-11 ENCOUNTER — Ambulatory Visit
Admission: RE | Admit: 2020-02-11 | Discharge: 2020-02-11 | Disposition: A | Discharge status: Home / Self Care | Payer: Medicare HMO | Source: Ambulatory Visit | Attending: Allergy | Admitting: Allergy

## 2020-02-11 ENCOUNTER — Other Ambulatory Visit: Payer: Self-pay | Admitting: Allergy

## 2020-02-11 DIAGNOSIS — R059 Cough, unspecified: Secondary | ICD-10-CM

## 2020-02-11 DIAGNOSIS — R05 Cough: Secondary | ICD-10-CM

## 2020-05-16 IMAGING — CR DG CHEST 2V
2 series · 2 of 2 positions shown · non-contrast
Comparison: No priors.

CLINICAL DATA: 68-year-old female with history of cough and
shortness of breath for the past 3 weeks.

EXAM:
CHEST - 2 VIEW

[w chest pa]
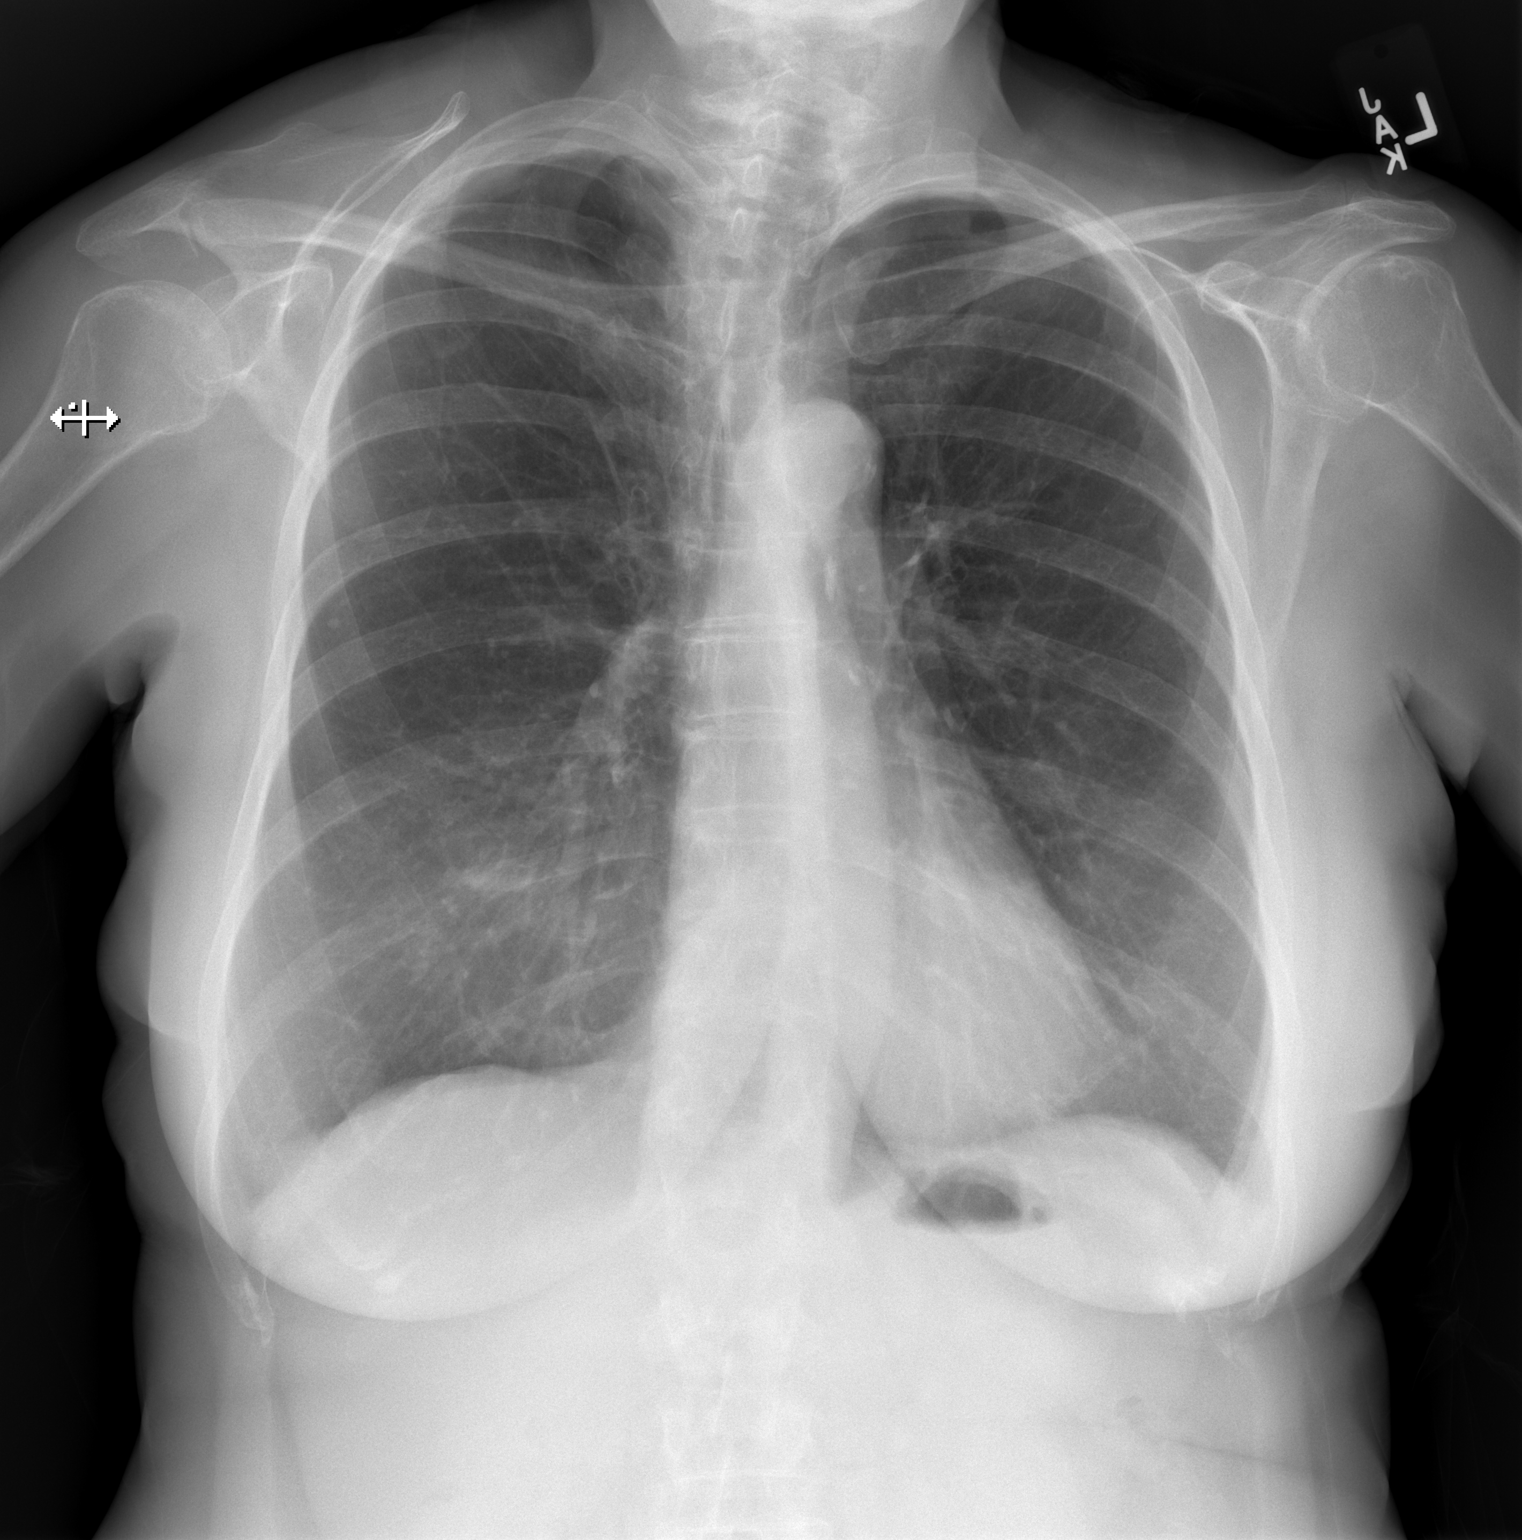

[w chest lat]
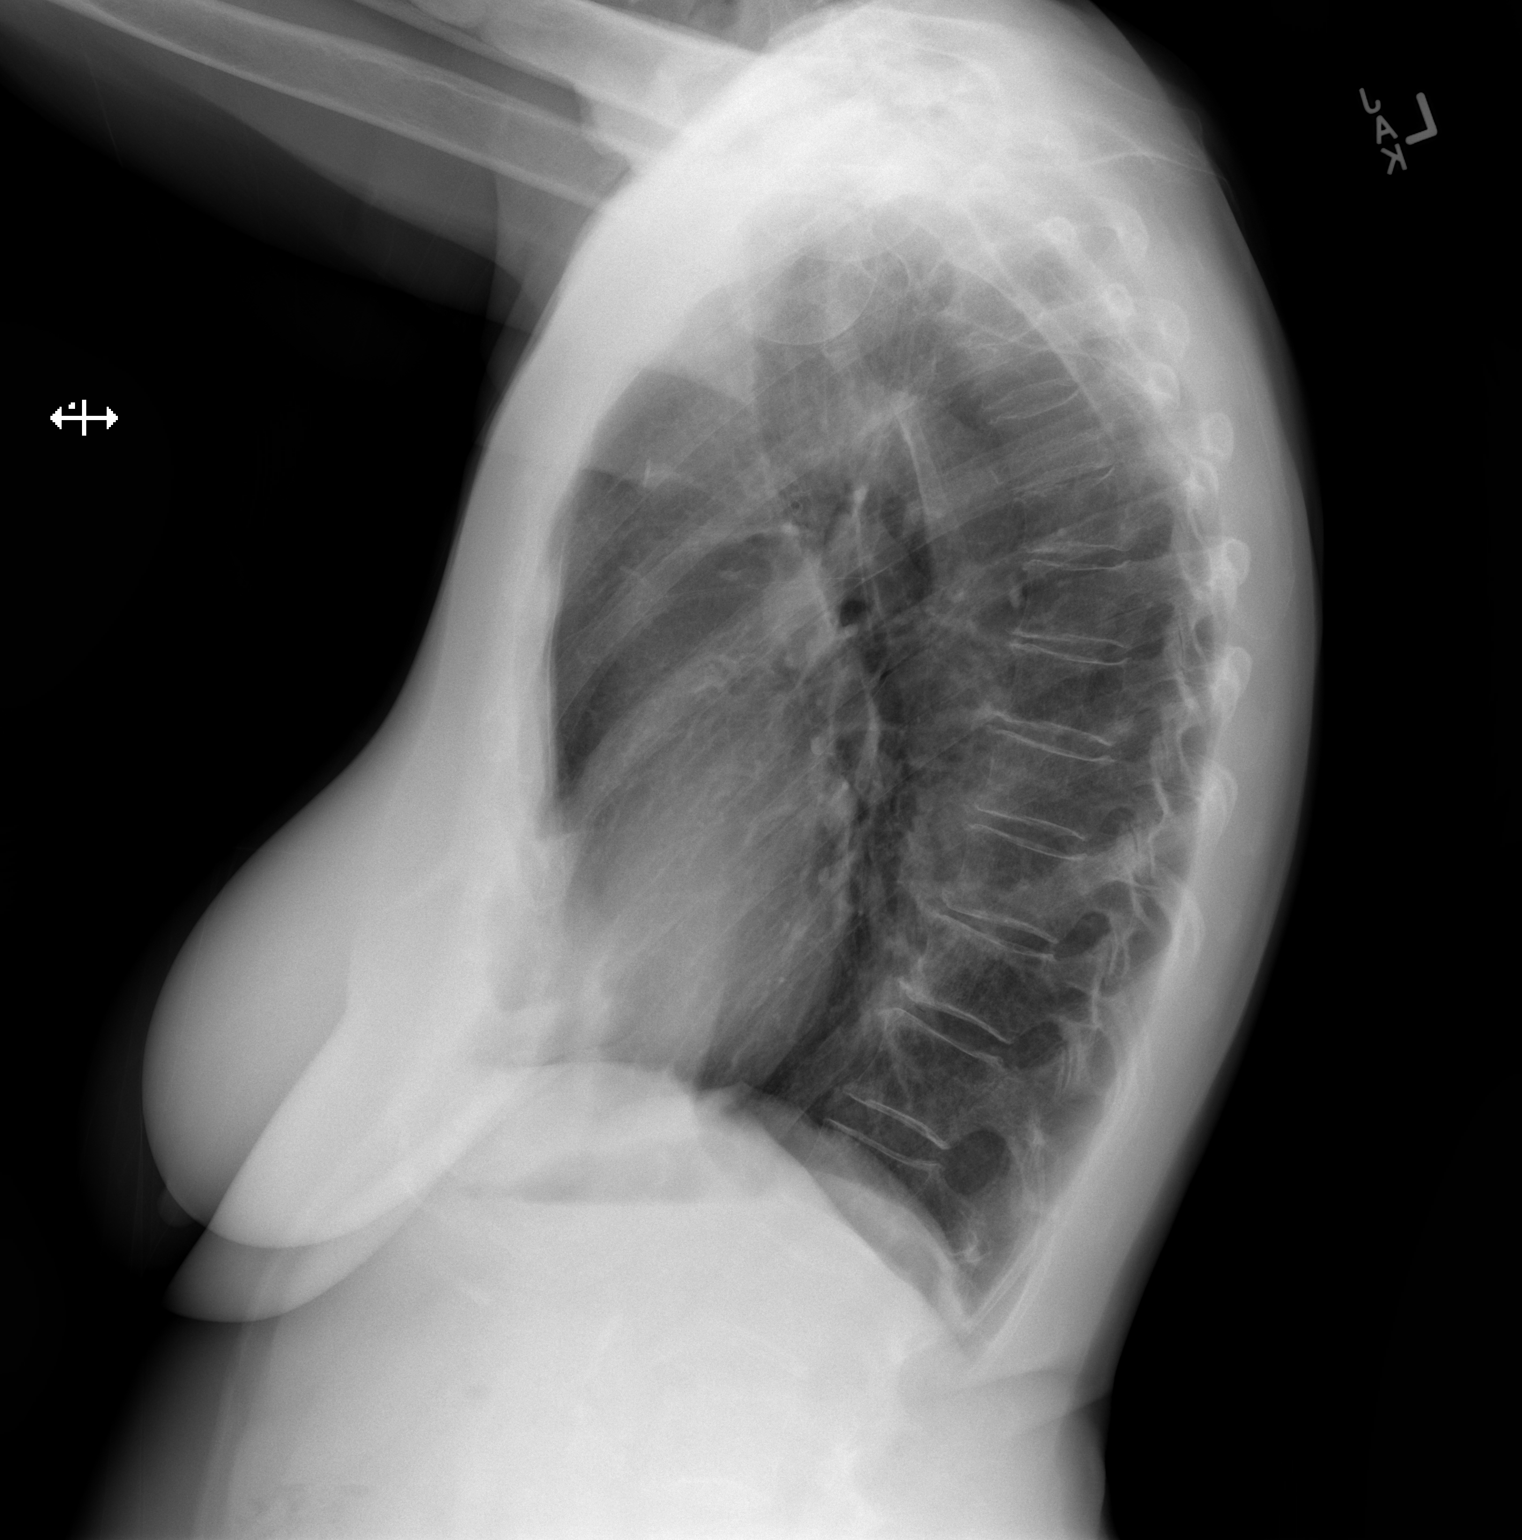

[2 of 2 positions shown; findings below may reference images not displayed]

FINDINGS: Lung volumes are normal. In the anterior aspect of the right middle
lobe there is a focal nodular area of architectural distortion. Left
lung is clear. No pleural effusions. No evidence of pulmonary edema.
Heart size is normal. Upper mediastinal contours are within normal
limits. Aortic atherosclerosis.
IMPRESSION: 1. Nodular area of architectural distortion in the anterior aspect
of the right middle lobe. This may simply represent a focus of
infection or inflammation, however, close attention on followup
studies is recommended to ensure resolution. Followup PA and lateral
chest X-ray is recommended in 3-4 weeks following trial of
antibiotic therapy to ensure resolution and exclude underlying
malignancy.
2. Aortic atherosclerosis.
